# Patient Record
Sex: Male | Born: 1980 | Race: Black or African American | Hispanic: No | Marital: Married | Smoking: Former smoker
Health system: Southern US, Community
[De-identification: ages and names within clinical notes are randomized; demographics above are authoritative.]

---

## 2009-10-13 ENCOUNTER — Encounter (INDEPENDENT_AMBULATORY_CARE_PROVIDER_SITE_OTHER): Payer: Self-pay | Admitting: *Deleted

## 2010-05-08 NOTE — Letter (Signed)
Summary: Unable to Reach, Consult Scheduled  [facility]  [address]   [place], [place] [address]   Phone: [phone]  Fax: [phone]    10/13/2009  [facility] [address] [place], [place]  [address] 08/09/1980   Dear Mr. KUSSMAN,   We have been unable to reach you by phone.  Please contact our office with an updated phone number.  At the recommendation of DR [facility] we have been asked to schedule you a consult with DR FIELDS for ABDOMINAL PAIN/ULCER.     Please call our office at [phone].     Thank you,    Diana Eves  [facility] R. Roetta Sessions, M.D.    Jonette Eva, M.D. Lorenza Burton, FNP-BC    Tana Coast, PA-C Phone: [phone]    Fax: [phone]

## 2010-10-07 ENCOUNTER — Emergency Department (HOSPITAL_COMMUNITY)
Admission: EM | Admit: 2010-10-07 | Discharge: 2010-10-08 | Disposition: A | Discharge status: Home / Self Care | Payer: Medicaid Other | Attending: Emergency Medicine | Admitting: Emergency Medicine

## 2010-10-07 DIAGNOSIS — S20229A Contusion of unspecified back wall of thorax, initial encounter: Secondary | ICD-10-CM | POA: Insufficient documentation

## 2010-10-07 DIAGNOSIS — R296 Repeated falls: Secondary | ICD-10-CM | POA: Insufficient documentation

## 2010-10-07 DIAGNOSIS — M545 Low back pain, unspecified: Secondary | ICD-10-CM | POA: Insufficient documentation

## 2010-10-07 DIAGNOSIS — S7000XA Contusion of unspecified hip, initial encounter: Secondary | ICD-10-CM | POA: Insufficient documentation

## 2010-10-07 DIAGNOSIS — M25559 Pain in unspecified hip: Secondary | ICD-10-CM | POA: Insufficient documentation

## 2010-10-08 ENCOUNTER — Emergency Department (HOSPITAL_COMMUNITY): Payer: Medicaid Other

## 2010-10-08 ENCOUNTER — Other Ambulatory Visit (HOSPITAL_COMMUNITY): Payer: Self-pay

## 2010-10-13 ENCOUNTER — Encounter: Payer: Self-pay | Admitting: Emergency Medicine

## 2010-10-13 ENCOUNTER — Emergency Department (HOSPITAL_COMMUNITY): Payer: Medicaid Other

## 2010-10-13 ENCOUNTER — Emergency Department (HOSPITAL_COMMUNITY)
Admission: EM | Admit: 2010-10-13 | Discharge: 2010-10-13 | Disposition: A | Discharge status: Home / Self Care | Payer: Medicaid Other | Attending: Emergency Medicine | Admitting: Emergency Medicine

## 2010-10-13 DIAGNOSIS — Z9181 History of falling: Secondary | ICD-10-CM

## 2010-10-13 DIAGNOSIS — W108XXA Fall (on) (from) other stairs and steps, initial encounter: Secondary | ICD-10-CM | POA: Insufficient documentation

## 2010-10-13 DIAGNOSIS — Z87891 Personal history of nicotine dependence: Secondary | ICD-10-CM | POA: Insufficient documentation

## 2010-10-13 DIAGNOSIS — M25552 Pain in left hip: Secondary | ICD-10-CM

## 2010-10-13 DIAGNOSIS — M79609 Pain in unspecified limb: Secondary | ICD-10-CM | POA: Insufficient documentation

## 2010-10-13 DIAGNOSIS — M545 Low back pain, unspecified: Secondary | ICD-10-CM

## 2010-10-13 MED ORDER — ORPHENADRINE CITRATE ER 100 MG PO TB12: 100.0000 mg | ORAL_TABLET | Freq: Two times a day (BID) | ORAL | Status: AC

## 2010-10-13 MED ORDER — NAPROXEN 500 MG PO TABS: 500.0000 mg | ORAL_TABLET | Freq: Two times a day (BID) | ORAL | Status: AC

## 2010-10-13 MED ORDER — OXYCODONE-ACETAMINOPHEN 5-325 MG PO TABS
1.0000 | ORAL_TABLET | Freq: Once | ORAL | Status: AC
  Administered 2010-10-13: 1 via ORAL
  Filled 2010-10-13: qty 1

## 2010-10-13 MED ORDER — OXYCODONE-ACETAMINOPHEN 5-325 MG PO TABS: 1.0000 | ORAL_TABLET | ORAL | Status: AC | PRN

## 2010-10-13 NOTE — ED Provider Notes (Signed)
History     Chief Complaint  Patient presents with  . Leg Pain   Patient is a 30 y.o. male presenting with fall.  Fall The accident occurred more than 2 days ago (He was lifting a heavy object and fell down abotu five steps.). Pain location: Pain is in the left lower lumbar area radiating into the left hip and thigh. He denies bowel or bladder dysfunction. The pain is at a severity of 10/10. The pain is severe. He was not ambulatory at the scene. The symptoms are aggravated by standing. He has tried NSAIDs for the symptoms. The treatment provided no relief.    History reviewed. No pertinent past medical history.  History reviewed. No pertinent past surgical history.  History reviewed. No pertinent family history.  History  Substance Use Topics  . Smoking status: Former Games developer  . Smokeless tobacco: Not on file  . Alcohol Use: No      Review of Systems  All other systems reviewed and are negative.    Physical Exam  BP 136/85  Pulse 71  Temp(Src) 99 F (37.2 C) (Oral)  Resp 18  Ht 5\' 3"  (1.6 m)  Wt 140 lb (63.504 kg)  BMI 24.80 kg/m2  SpO2 98%  Physical Exam  Constitutional: He is oriented to person, place, and time. He appears well-developed and well-nourished.  HENT:  Head: Normocephalic and atraumatic.  Right Ear: External ear normal.  Left Ear: External ear normal.  Mouth/Throat: Oropharynx is clear and moist.  Eyes: Conjunctivae and EOM are normal. Pupils are equal, round, and reactive to light.  Neck: Normal range of motion. Neck supple.  Cardiovascular: Normal rate, regular rhythm and normal heart sounds.   Abdominal: Soft. Bowel sounds are normal. He exhibits no mass. There is no tenderness.  Musculoskeletal: Normal range of motion.       Moderate tenderness in the left lower paralumbar area, left pelvis and left hip. Mild pain with ROM of the left hip.  Lymphadenopathy:    He has no cervical adenopathy.  Neurological: He is alert and oriented to person,  place, and time. No cranial nerve deficit. Coordination normal.  Skin: Skin is warm and dry. No rash noted.  Psychiatric: He has a normal mood and affect.    ED Course  Procedures Painimproved with Percocet.  MDM Fracture versus contusion versus sciatica.      Dione Booze, MD 10/13/10 [phone]

## 2010-10-13 NOTE — ED Notes (Signed)
Pt c/o pain to lower back pain that radiates down L leg. Heavy lifting and fell at time of incident. NAD

## 2010-10-13 NOTE — ED Notes (Signed)
Pt returned from x-ray. Blanket was given. Family at bedside. NAD noted at this time.

## 2010-10-21 ENCOUNTER — Emergency Department (HOSPITAL_COMMUNITY): Payer: Medicaid Other

## 2010-10-21 ENCOUNTER — Emergency Department (HOSPITAL_COMMUNITY)
Admission: EM | Admit: 2010-10-21 | Discharge: 2010-10-21 | Disposition: A | Discharge status: Home / Self Care | Payer: Medicaid Other | Attending: Emergency Medicine | Admitting: Emergency Medicine

## 2010-10-21 ENCOUNTER — Encounter (HOSPITAL_COMMUNITY): Payer: Self-pay

## 2010-10-21 DIAGNOSIS — S335XXA Sprain of ligaments of lumbar spine, initial encounter: Secondary | ICD-10-CM | POA: Insufficient documentation

## 2010-10-21 DIAGNOSIS — W108XXA Fall (on) (from) other stairs and steps, initial encounter: Secondary | ICD-10-CM | POA: Insufficient documentation

## 2010-10-21 DIAGNOSIS — Z87891 Personal history of nicotine dependence: Secondary | ICD-10-CM | POA: Insufficient documentation

## 2010-10-21 DIAGNOSIS — M79609 Pain in unspecified limb: Secondary | ICD-10-CM | POA: Insufficient documentation

## 2010-10-21 DIAGNOSIS — M542 Cervicalgia: Secondary | ICD-10-CM | POA: Insufficient documentation

## 2010-10-21 MED ORDER — TRAMADOL HCL 50 MG PO TABS: 50.0000 mg | ORAL_TABLET | Freq: Four times a day (QID) | ORAL | Status: AC | PRN

## 2010-10-21 NOTE — ED Notes (Signed)
Pt presents with c/o neck and left leg pain after falling down stairs 4 days ago. Pt to triage via w/c. Pt has full ROM in triage. Pt shaking head no when asked a question.

## 2010-10-21 NOTE — ED Provider Notes (Addendum)
History     Chief Complaint  Patient presents with  . Neck Injury  . Leg Pain   HPI Comments: Patient tripped going down a flight of steps 4 days ago,  Landing on his lower back.  His pain is not improved despite ibuprofen and rest.  Patient is a 30 y.o. male presenting with neck injury and leg pain. The history is provided by the patient.  Neck Injury This is a new problem. The current episode started in the past 7 days. The problem occurs constantly. The problem has been unchanged. Pertinent negatives include no fever, myalgias, numbness or weakness. Exacerbated by: Pain is worsened with movement of his neck and lower back. He has tried NSAIDs for the symptoms. The treatment provided no relief.  Leg Pain  The incident occurred more than 2 days ago. The incident occurred at home. The injury mechanism was a fall. Pain location: He describes pain in his lower back which radiates to his left posterior thigh. The quality of the pain is described as sharp. The pain is at a severity of 5/10. The pain is moderate. The pain has been constant since onset. Pertinent negatives include no numbness, no inability to bear weight, no muscle weakness and no loss of sensation. The symptoms are aggravated by bearing weight and activity. He has tried NSAIDs for the symptoms. The treatment provided mild relief.    History reviewed. No pertinent past medical history.  History reviewed. No pertinent past surgical history.  History reviewed. No pertinent family history.  History  Substance Use Topics  . Smoking status: Former Games developer  . Smokeless tobacco: Not on file  . Alcohol Use: No      Review of Systems  Constitutional: Negative for fever.  Musculoskeletal: Negative for myalgias.  Neurological: Negative for weakness and numbness.    Physical Exam  BP 123/78  Pulse 79  Temp(Src) 98.7 F (37.1 C) (Oral)  Resp 20  Ht 5\' 3"  (1.6 m)  SpO2 99%  Physical Exam  Constitutional: He is oriented to  person, place, and time. He appears well-developed and well-nourished.  HENT:  Head: Normocephalic.  Eyes: Conjunctivae are normal.  Neck: Normal range of motion. Neck supple. Muscular tenderness present. No spinous process tenderness present. No rigidity. No edema, no erythema and normal range of motion present.  Cardiovascular: Regular rhythm and intact distal pulses.        Pedal pulses normal.  Pulmonary/Chest: Effort normal. He has no wheezes.  Abdominal: Soft. Bowel sounds are normal. He exhibits no distension and no mass.  Musculoskeletal: Normal range of motion. He exhibits no edema.       Lumbar back: He exhibits tenderness. He exhibits no swelling, no edema and no spasm.  Lymphadenopathy:    He has no cervical adenopathy.  Neurological: He is alert and oriented to person, place, and time. He has normal strength. He displays no atrophy and no tremor. No cranial nerve deficit or sensory deficit. Gait normal.  Reflex Scores:      Bicep reflexes are 2+ on the right side and 2+ on the left side.      Brachioradialis reflexes are 2+ on the right side and 2+ on the left side.      Patellar reflexes are 2+ on the right side and 2+ on the left side.      Achilles reflexes are 2+ on the right side and 2+ on the left side.      No strength deficit noted in hip  and knee flexor and extensor muscle groups.  Ankle flexion and extension intact.  Equal grip strength.  Skin: Skin is warm and dry.  Psychiatric: He has a normal mood and affect.    ED Course  Procedures  MDM Note that patient was seen here on 10/13/10 for similar complaint,  States todays presentation is for a new event of trauma.        Candis Musa, PA 10/21/10 1726  Candis Musa, PA 10/21/10 1735  Candis Musa, PA 12/17/10 1615

## 2010-10-21 NOTE — ED Notes (Signed)
Pt upset at time of delivery secondary to rx prescribed for him.  Encouraged pt to seek a regular medical dr and follow up care with DR of his choice or [facility] dept.

## 2010-10-21 NOTE — ED Notes (Signed)
Pt returned from radiology via wheelchair. Pt states is still in pain. 7/10.

## 2010-10-21 NOTE — ED Notes (Signed)
Pt states fell down entire flight of stairs Thursday night. Unsure/unclear as to how he landed. Denies alcohol use at that time. States pain is in back of left leg and entire back into his lower neck. No bruising or edema noted. States pain level is a 7/10

## 2010-10-23 NOTE — ED Provider Notes (Signed)
Medical screening examination/treatment/procedure(s) were performed by non-physician practitioner and as supervising physician I was immediately available for consultation/collaboration.  Hurman Horn, MD 10/23/10 [phone]

## 2010-12-23 NOTE — ED Provider Notes (Signed)
Medical screening examination/treatment/procedure(s) were performed by non-physician practitioner and as supervising physician I was immediately available for consultation/collaboration.  Hurman Horn, MD 12/23/10 [phone]

## 2019-07-18 ENCOUNTER — Other Ambulatory Visit: Payer: Self-pay

## 2019-07-18 ENCOUNTER — Encounter (HOSPITAL_COMMUNITY): Payer: Self-pay | Admitting: Emergency Medicine

## 2019-07-18 ENCOUNTER — Emergency Department (HOSPITAL_COMMUNITY): Payer: Medicaid Other

## 2019-07-18 ENCOUNTER — Emergency Department (HOSPITAL_COMMUNITY)
Admission: EM | Admit: 2019-07-18 | Discharge: 2019-07-18 | Disposition: A | Discharge status: Home / Self Care | Payer: Medicaid Other | Attending: Emergency Medicine | Admitting: Emergency Medicine

## 2019-07-18 DIAGNOSIS — Z87891 Personal history of nicotine dependence: Secondary | ICD-10-CM | POA: Insufficient documentation

## 2019-07-18 DIAGNOSIS — R109 Unspecified abdominal pain: Secondary | ICD-10-CM

## 2019-07-18 DIAGNOSIS — Z79899 Other long term (current) drug therapy: Secondary | ICD-10-CM | POA: Insufficient documentation

## 2019-07-18 DIAGNOSIS — M545 Low back pain, unspecified: Secondary | ICD-10-CM

## 2019-07-18 LAB — BASIC METABOLIC PANEL
Anion gap: 8 (ref 5–15)
BUN: 10 mg/dL (ref 6–20)
CO2: 26 mmol/L (ref 22–32)
Calcium: 9.2 mg/dL (ref 8.9–10.3)
Chloride: 104 mmol/L (ref 98–111)
Creatinine, Ser: 1.07 mg/dL (ref 0.61–1.24)
GFR calc Af Amer: >60 mL/min (ref >60)
GFR calc non Af Amer: >60 mL/min (ref >60)
Glucose, Bld: 108 mg/dL — ABNORMAL HIGH (ref 70–99)
Potassium: 4 mmol/L (ref 3.5–5.1)
Sodium: 138 mmol/L (ref 135–145)

## 2019-07-18 LAB — URINALYSIS, ROUTINE W REFLEX MICROSCOPIC
Bilirubin Urine: NEGATIVE
Glucose, UA: NEGATIVE mg/dL
Hgb urine dipstick: NEGATIVE
Ketones, ur: NEGATIVE mg/dL
Leukocytes,Ua: NEGATIVE
Nitrite: NEGATIVE
Protein, ur: NEGATIVE mg/dL
Specific Gravity, Urine: 1.019 (ref 1.005–1.030)
pH: 6 (ref 5.0–8.0)

## 2019-07-18 LAB — CBC
HCT: 44.6 % (ref 39.0–52.0)
Hemoglobin: 14 g/dL (ref 13.0–17.0)
MCH: 30.4 pg (ref 26.0–34.0)
MCHC: 31.4 g/dL (ref 30.0–36.0)
MCV: 96.7 fL (ref 80.0–100.0)
Platelets: 274 10*3/uL (ref 150–400)
RBC: 4.61 MIL/uL (ref 4.22–5.81)
RDW: 12.3 % (ref 11.5–15.5)
WBC: 8 10*3/uL (ref 4.0–10.5)
nRBC: 0 % (ref 0.0–0.2)

## 2019-07-18 MED ORDER — HYDROCODONE-ACETAMINOPHEN 5-325 MG PO TABS: 1.0000 | ORAL_TABLET | Freq: Four times a day (QID) | ORAL | 0 refills | Status: AC | PRN

## 2019-07-18 MED ORDER — CYCLOBENZAPRINE HCL 10 MG PO TABS: 10.0000 mg | ORAL_TABLET | Freq: Three times a day (TID) | ORAL | 0 refills | Status: AC

## 2019-07-18 MED ORDER — NAPROXEN 500 MG PO TABS: 500.0000 mg | ORAL_TABLET | Freq: Two times a day (BID) | ORAL | 0 refills | Status: AC

## 2019-07-18 NOTE — ED Triage Notes (Addendum)
Pt C/O right sided lower back pain that started yesterday. Pt denies urinary symptoms.   Pt reports the pain is worse with movement.

## 2019-07-18 NOTE — Discharge Instructions (Addendum)
Work-up for any kidney stones without evidence of any stone in the ureter.  This may be musculoskeletal back pain.  Take the Flexeril as needed.  Take the Naprosyn as directed for the next 7 days.  Return for any new or worse symptoms.  Take the hydrocodone as needed for additional pain relief.

## 2019-07-18 NOTE — ED Provider Notes (Signed)
[facility] EMERGENCY DEPARTMENT Provider Note   CSN: [id number] Arrival date & time: 07/18/19  0530     History Chief Complaint  Patient presents with  . Back Pain    Austin Strong is a 39 y.o. male.  Patient presenting with a complaint of right sided back pain flank pain.  Started yesterday.  No nausea or vomiting.  It does hurt a little bit more with moving no history of injury.  Patient does have a history of kidney stones in the past.  He is concerned that may be what it is.  Denies any blood in his urine.  Or any urinary symptoms.        History reviewed. No pertinent past medical history.  There are no problems to display for this patient.   History reviewed. No pertinent surgical history.     History reviewed. No pertinent family history.  Social History   Tobacco Use  . Smoking status: Former Games developer  . Smokeless tobacco: Never Used  Substance Use Topics  . Alcohol use: No  . Drug use: No    Home Medications Prior to Admission medications   Medication Sig Start Date End Date Taking? Authorizing Provider  omeprazole (PRILOSEC) 20 MG capsule Take 20 mg by mouth daily. 05/04/19  Yes [provider]  cyclobenzaprine (FLEXERIL) 10 MG tablet Take 1 tablet (10 mg total) by mouth 3 (three) times daily. 07/18/19   Vanetta Mulders, MD  HYDROcodone-acetaminophen (NORCO/VICODIN) 5-325 MG tablet Take 1 tablet by mouth every 6 (six) hours as needed. 07/18/19   Vanetta Mulders, MD  naproxen (NAPROSYN) 500 MG tablet Take 1 tablet (500 mg total) by mouth 2 (two) times daily. 07/18/19   Vanetta Mulders, MD    Allergies    Patient has no known allergies.  Review of Systems   Review of Systems  Constitutional: Negative for chills and fever.  HENT: Negative for rhinorrhea and sore throat.   Eyes: Negative for visual disturbance.  Respiratory: Negative for cough and shortness of breath.   Cardiovascular: Negative for chest pain and leg swelling.    Gastrointestinal: Negative for abdominal pain, diarrhea, nausea and vomiting.  Genitourinary: Positive for flank pain. Negative for dysuria.  Musculoskeletal: Positive for back pain. Negative for neck pain.  Skin: Negative for rash.  Neurological: Negative for dizziness, light-headedness and headaches.  Hematological: Does not bruise/bleed easily.  Psychiatric/Behavioral: Negative for confusion.    Physical Exam Updated Vital Signs BP 119/76 (BP Location: Right Arm)   Pulse 71   Temp 98.1 F (36.7 C) (Oral)   Resp 17   Ht 1.6 m (5\' 3" )   Wt 90.7 kg   SpO2 99%   BMI 35.43 kg/m   Physical Exam Vitals and nursing note reviewed.  Constitutional:      Appearance: Normal appearance. He is well-developed.  HENT:     Head: Normocephalic and atraumatic.  Eyes:     Extraocular Movements: Extraocular movements intact.     Conjunctiva/sclera: Conjunctivae normal.     Pupils: Pupils are equal, round, and reactive to light.  Cardiovascular:     Rate and Rhythm: Normal rate and regular rhythm.     Heart sounds: No murmur.  Pulmonary:     Effort: Pulmonary effort is normal. No respiratory distress.     Breath sounds: Normal breath sounds.  Abdominal:     Palpations: Abdomen is soft.     Tenderness: There is no abdominal tenderness.  Musculoskeletal:  General: No tenderness. Normal range of motion.     Cervical back: Normal range of motion and neck supple.     Comments: No tenderness to palpation right flank or right CVA area.  Skin:    General: Skin is warm and dry.     Capillary Refill: Capillary refill takes less than 2 seconds.  Neurological:     General: No focal deficit present.     Mental Status: He is alert and oriented to person, place, and time.     Cranial Nerves: No cranial nerve deficit.     Sensory: No sensory deficit.     Motor: No weakness.     Coordination: Coordination normal.     ED Results / Procedures / Treatments   Labs (all labs ordered are  listed, but only abnormal results are displayed) Labs Reviewed  URINALYSIS, ROUTINE W REFLEX MICROSCOPIC - Abnormal; Notable for the following components:      Result Value   Color, Urine STRAW (*)    All other components within normal limits  BASIC METABOLIC PANEL - Abnormal; Notable for the following components:   Glucose, Bld 108 (*)    All other components within normal limits  CBC    EKG None  Radiology CT Renal Stone Study  Result Date: 07/18/2019 CLINICAL DATA:  Kidney stone suspected. RIGHT-sided pain and flank pain. EXAM: CT ABDOMEN AND PELVIS WITHOUT CONTRAST TECHNIQUE: Multidetector CT imaging of the abdomen and pelvis was performed following the standard protocol without IV contrast. COMPARISON:  None. FINDINGS: Lower chest: Lung bases are clear. Hepatobiliary: No focal hepatic lesion. No biliary duct dilatation. Gallbladder is normal. Common bile duct is normal. Pancreas: Pancreas is normal. No ductal dilatation. No pancreatic inflammation. Spleen: Normal spleen Adrenals/urinary tract: Adrenal glands normal. Small 1 mm nonobstructing calculus in the mid RIGHT kidney (image 45/2). No ureterolithiasis or obstructive uropathy. No bladder calculi. Stomach/Bowel: Stomach, small bowel, appendix (image 55/2), and cecum are normal. The colon and rectosigmoid colon are normal. Vascular/Lymphatic: Abdominal aorta is normal caliber. No periportal or retroperitoneal adenopathy. No pelvic adenopathy. Reproductive: Prostate normal Other: No free fluid. Musculoskeletal: No acute osseous abnormality. IMPRESSION: 1. Tiny nonobstructing RIGHT renal calculus. 2. No ureterolithiasis or obstructive uropathy.  No bladder calculi. 3. Normal appendix. Electronically Signed   By: Suzy Bouchard M.D.   On: 07/18/2019 09:08    Procedures Procedures (including critical care time)  Medications Ordered in ED Medications - No data to display  ED Course  I have reviewed the triage vital signs and the  nursing notes.  Pertinent labs & imaging results that were available during my care of the patient were reviewed by me and considered in my medical decision making (see chart for details).    MDM Rules/Calculators/A&P                      Work-up shows no evidence ureteral stone.  There is a stone in the right kidney area.  No evidence of a recent passage of stone.  Urinalysis negative.  Patient's blood sugar slightly elevated.  Renal functions normal.  No other significant electrolyte abnormalities.  No leukocytosis.   Will treat as musculoskeletal back pain.    Final Clinical Impression(s) / ED Diagnoses Final diagnoses:  Acute right-sided low back pain without sciatica  Flank pain    Rx / DC Orders ED Discharge Orders         Ordered    cyclobenzaprine (FLEXERIL) 10 MG tablet  3  times daily     07/18/19 1027    naproxen (NAPROSYN) 500 MG tablet  2 times daily     07/18/19 1027    HYDROcodone-acetaminophen (NORCO/VICODIN) 5-325 MG tablet  Every 6 hours PRN     07/18/19 1027           Vanetta Mulders, MD 07/18/19 1032

## 2019-07-19 LAB — CBG MONITORING, ED: Glucose-Capillary: 180 mg/dL — ABNORMAL HIGH (ref 70–99)

## 2021-05-22 IMAGING — CT CT RENAL STONE PROTOCOL
2 of 4 series · 17 of 46 positions shown, 19 images · non-contrast
Comparison: None.

CLINICAL DATA: Kidney stone suspected. RIGHT-sided pain and flank
pain.

EXAM:
CT ABDOMEN AND PELVIS WITHOUT CONTRAST
TECHNIQUE: Multidetector CT imaging of the abdomen and pelvis was performed
following the standard protocol without IV contrast.

[Series 2: axial st · axial · 0.73mm/px · z∈[+766,+1161]mm · 14 of 93 slices shown, 16 images]
[im 7/93  soft-tissue]
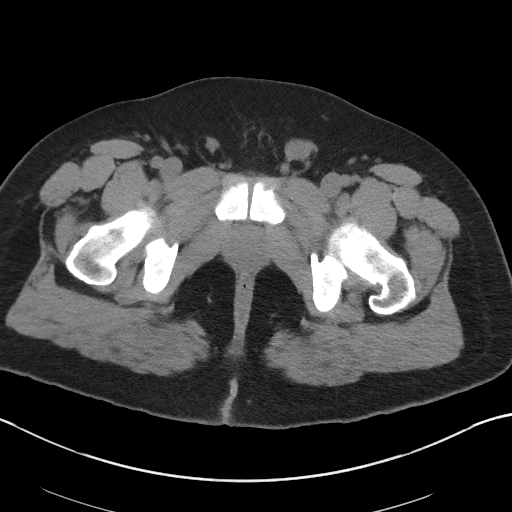
[im 7/93  bone]
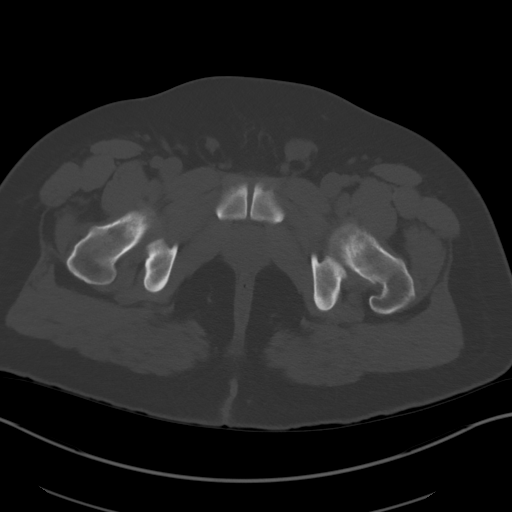
[im 13/93  soft-tissue]
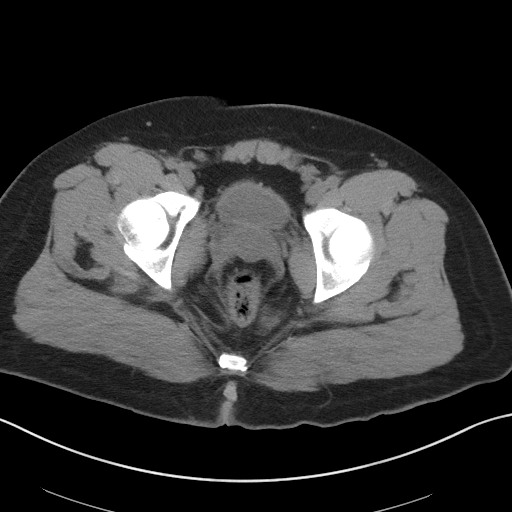
[im 19/93  soft-tissue]
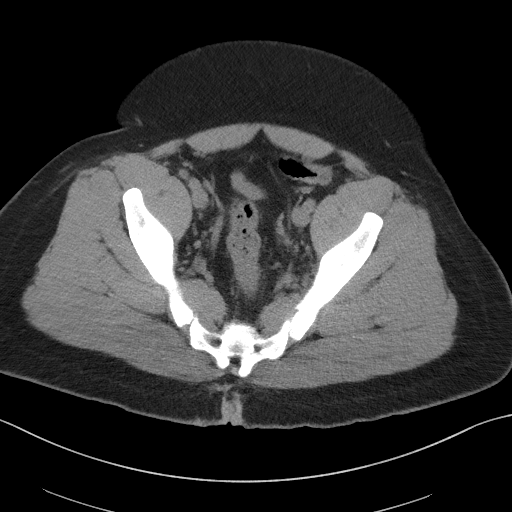
[im 25/93  soft-tissue]
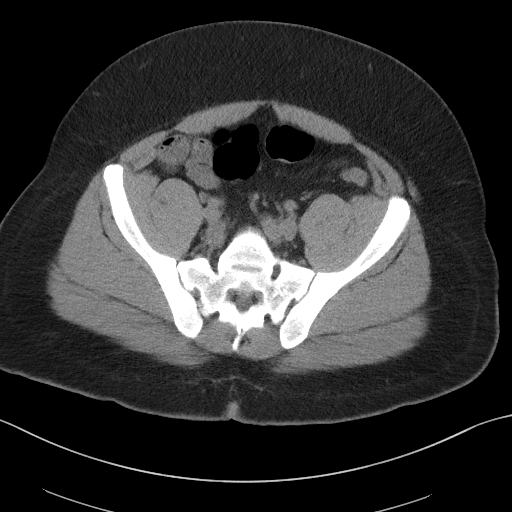
[im 31/93  soft-tissue]
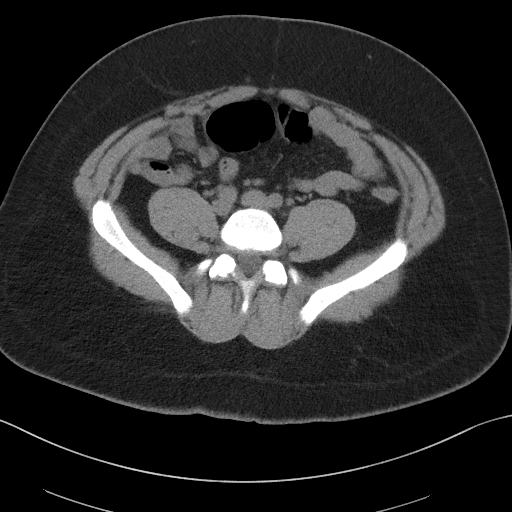
[im 37/93  soft-tissue]
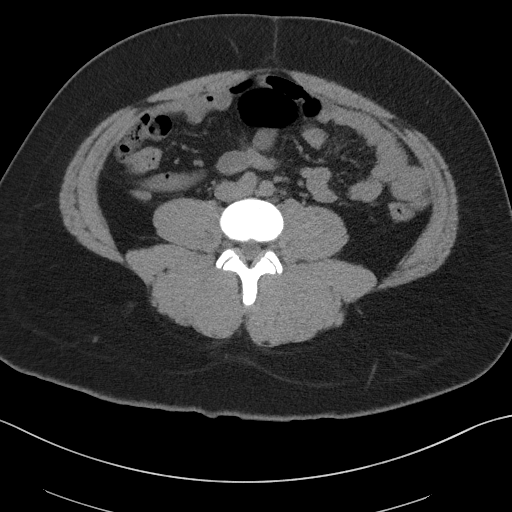
[im 43/93  soft-tissue]
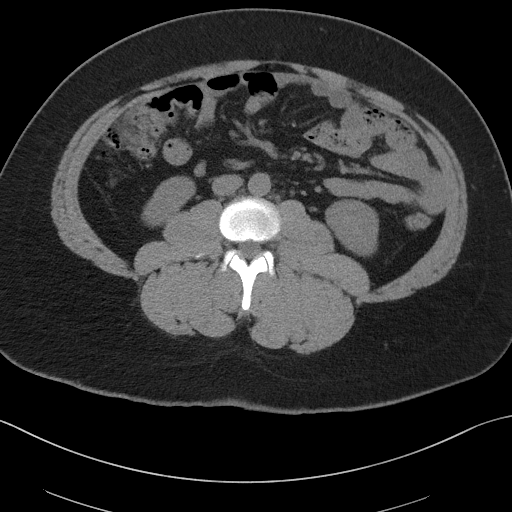
[im 50/93  soft-tissue]
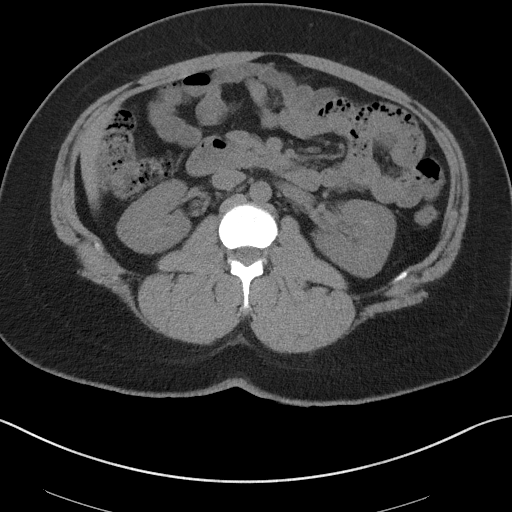
[im 56/93  soft-tissue]
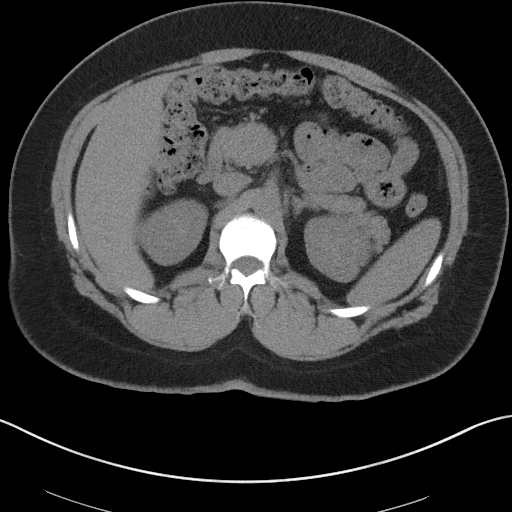
[im 56/93  bone]
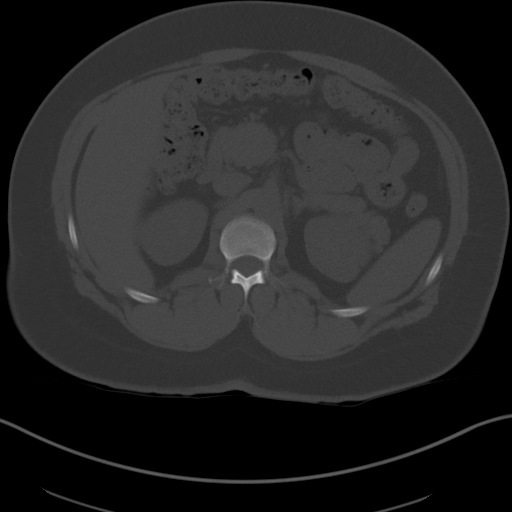
[im 62/93  soft-tissue]
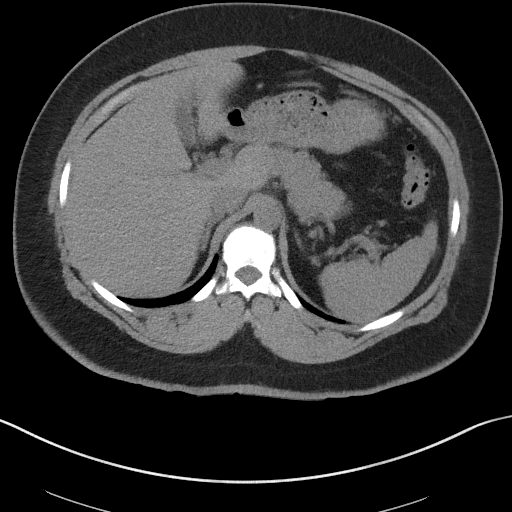
[im 68/93  soft-tissue]
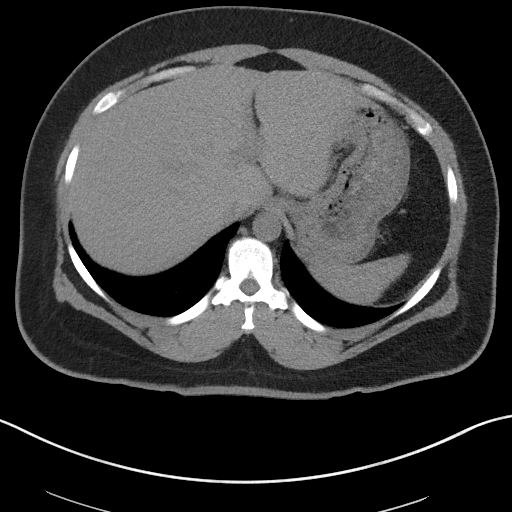
[im 74/93  soft-tissue]
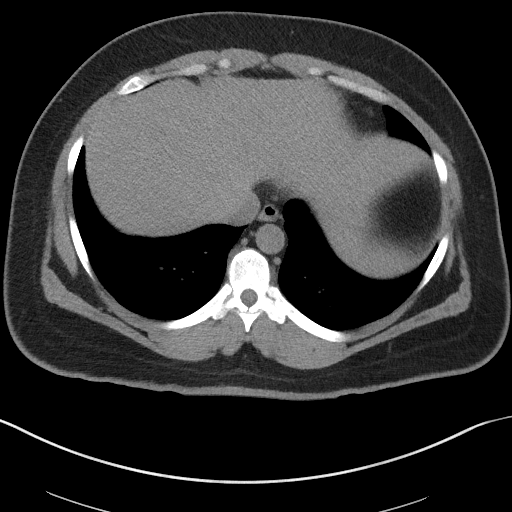
[im 80/93  soft-tissue]
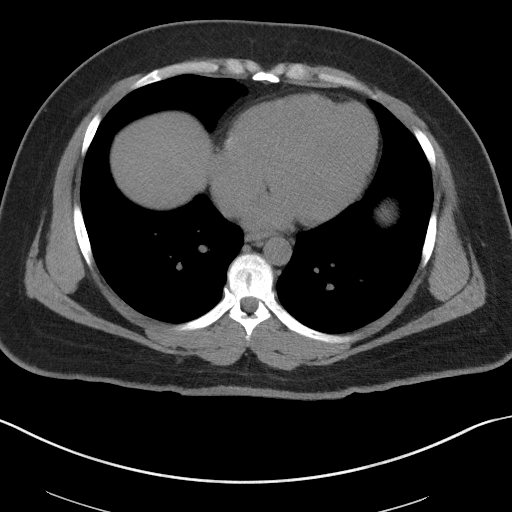
[im 86/93  soft-tissue]
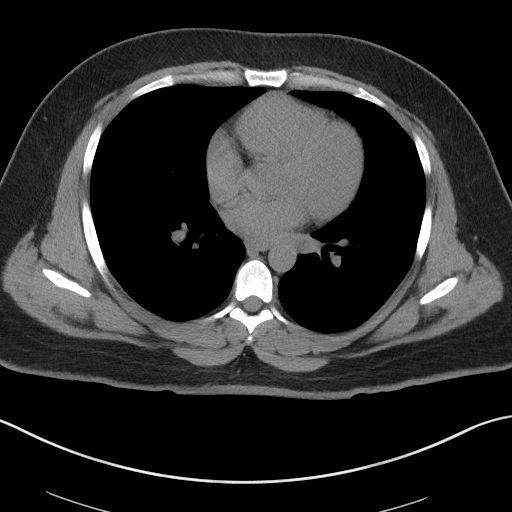

[Series 5: coronal st · coronal · 0.83mm/px · 3 of 101 slices shown]
[im 34/101  soft-tissue]
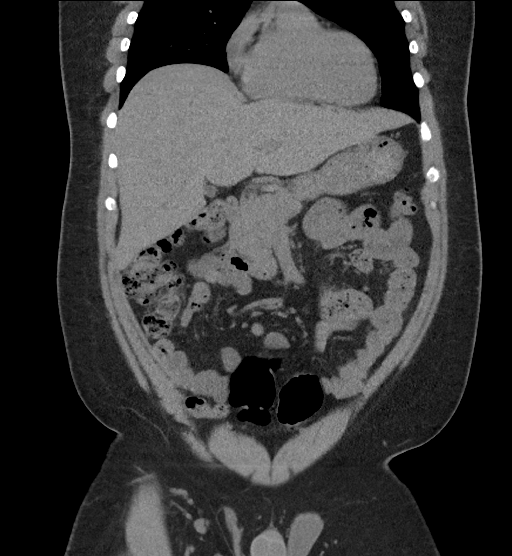
[im 45/101  soft-tissue]
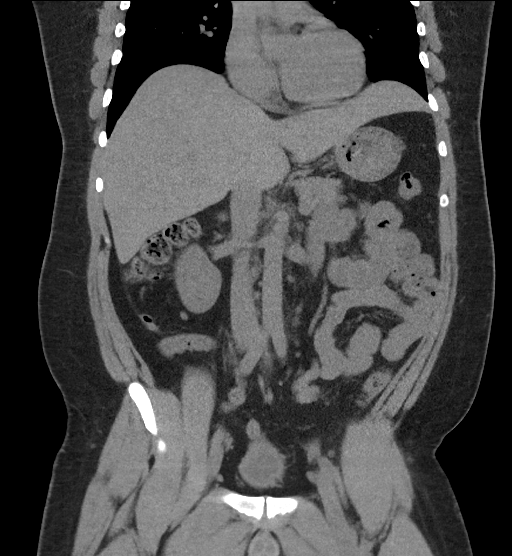
[im 56/101  soft-tissue]
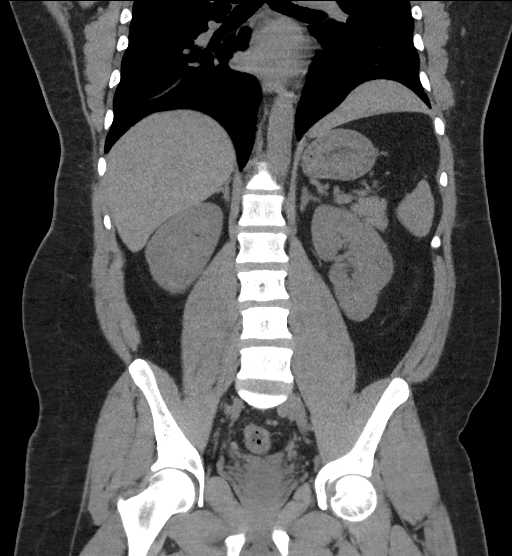

[17 of 46 positions shown; findings below may reference images not displayed]

FINDINGS: Lower chest: Lung bases are clear.

Hepatobiliary: No focal hepatic lesion. No biliary duct dilatation.
Gallbladder is normal. Common bile duct is normal.

Pancreas: Pancreas is normal. No ductal dilatation. No pancreatic
inflammation.

Spleen: Normal spleen

Adrenals/urinary tract: Adrenal glands normal. Small 1 mm
nonobstructing calculus in the mid RIGHT kidney (image 45/2). No
ureterolithiasis or obstructive uropathy. No bladder calculi.

Stomach/Bowel: Stomach, small bowel, appendix (image 55/2), and
cecum are normal. The colon and rectosigmoid colon are normal.

Vascular/Lymphatic: Abdominal aorta is normal caliber. No periportal
or retroperitoneal adenopathy. No pelvic adenopathy.

Reproductive: Prostate normal

Other: No free fluid.

Musculoskeletal: No acute osseous abnormality.
IMPRESSION: 1. Tiny nonobstructing RIGHT renal calculus.
2. No ureterolithiasis or obstructive uropathy.  No bladder calculi.
3. Normal appendix.

## 2023-06-27 ENCOUNTER — Telehealth (INDEPENDENT_AMBULATORY_CARE_PROVIDER_SITE_OTHER): Admitting: Pediatric Genetics
